# Patient Record
Sex: Male | Born: 1965 | Hispanic: No | Marital: Married
Health system: Southern US, Community
[De-identification: ages and names within clinical notes are randomized; demographics above are authoritative.]

---

## 2003-07-05 ENCOUNTER — Ambulatory Visit (HOSPITAL_COMMUNITY): Admission: RE | Admit: 2003-07-05 | Discharge: 2003-07-05 | Payer: Self-pay | Admitting: Orthopedic Surgery

## 2016-11-28 ENCOUNTER — Emergency Department: Payer: Self-pay

## 2016-11-28 ENCOUNTER — Emergency Department
Admission: EM | Admit: 2016-11-28 | Discharge: 2016-11-28 | Disposition: A | Discharge status: Home / Self Care | Payer: Self-pay | Attending: Emergency Medicine | Admitting: Emergency Medicine

## 2016-11-28 DIAGNOSIS — R0603 Acute respiratory distress: Secondary | ICD-10-CM

## 2016-11-28 DIAGNOSIS — J45901 Unspecified asthma with (acute) exacerbation: Secondary | ICD-10-CM | POA: Insufficient documentation

## 2016-11-28 LAB — CBC WITH DIFFERENTIAL/PLATELET
BASOS PCT: 1 %
Basophils Absolute: 0.1 10*3/uL (ref 0–0.1)
EOS ABS: 1.6 10*3/uL — AB (ref 0–0.7)
EOS PCT: 18 %
HCT: 44.4 % (ref 40.0–52.0)
Hemoglobin: 15.3 g/dL (ref 13.0–18.0)
Lymphocytes Relative: 26 %
Lymphs Abs: 2.4 10*3/uL (ref 1.0–3.6)
MCH: 29.7 pg (ref 26.0–34.0)
MCHC: 34.4 g/dL (ref 32.0–36.0)
MCV: 86.4 fL (ref 80.0–100.0)
MONO ABS: 0.7 10*3/uL (ref 0.2–1.0)
MONOS PCT: 8 %
NEUTROS PCT: 47 %
Neutro Abs: 4.4 10*3/uL (ref 1.4–6.5)
PLATELETS: 293 10*3/uL (ref 150–440)
RBC: 5.13 MIL/uL (ref 4.40–5.90)
RDW: 13.6 % (ref 11.5–14.5)
WBC: 9.2 10*3/uL (ref 3.8–10.6)

## 2016-11-28 LAB — BASIC METABOLIC PANEL
Anion gap: 10 (ref 5–15)
BUN: 12 mg/dL (ref 6–20)
CALCIUM: 9.2 mg/dL (ref 8.9–10.3)
CO2: 26 mmol/L (ref 22–32)
CREATININE: 1.36 mg/dL — AB (ref 0.61–1.24)
Chloride: 104 mmol/L (ref 101–111)
GFR calc non Af Amer: 59 mL/min — ABNORMAL LOW (ref >60)
Glucose, Bld: 112 mg/dL — ABNORMAL HIGH (ref 65–99)
Potassium: 3.7 mmol/L (ref 3.5–5.1)
SODIUM: 140 mmol/L (ref 135–145)

## 2016-11-28 LAB — TROPONIN I

## 2016-11-28 MED ORDER — IPRATROPIUM-ALBUTEROL 0.5-2.5 (3) MG/3ML IN SOLN
3.0000 mL | Freq: Once | RESPIRATORY_TRACT | Status: AC
  Administered 2016-11-28: 3 mL via RESPIRATORY_TRACT

## 2016-11-28 MED ORDER — METHYLPREDNISOLONE SODIUM SUCC 125 MG IJ SOLR
INTRAMUSCULAR | Status: AC
  Filled 2016-11-28: qty 2

## 2016-11-28 MED ORDER — PREDNISONE 20 MG PO TABS: ORAL_TABLET | ORAL | 0 refills | Status: AC

## 2016-11-28 MED ORDER — IPRATROPIUM-ALBUTEROL 0.5-2.5 (3) MG/3ML IN SOLN
3.0000 mL | Freq: Once | RESPIRATORY_TRACT | Status: AC
  Administered 2016-11-28: 3 mL via RESPIRATORY_TRACT
  Filled 2016-11-28: qty 3

## 2016-11-28 MED ORDER — ALBUTEROL SULFATE HFA 108 (90 BASE) MCG/ACT IN AERS
2.0000 | INHALATION_SPRAY | RESPIRATORY_TRACT | Status: DC | PRN
  Filled 2016-11-28: qty 6.7

## 2016-11-28 MED ORDER — IPRATROPIUM-ALBUTEROL 0.5-2.5 (3) MG/3ML IN SOLN
RESPIRATORY_TRACT | Status: AC
  Filled 2016-11-28: qty 3

## 2016-11-28 MED ORDER — METHYLPREDNISOLONE SODIUM SUCC 125 MG IJ SOLR
125.0000 mg | Freq: Once | INTRAMUSCULAR | Status: AC
  Administered 2016-11-28: 125 mg via INTRAVENOUS

## 2016-11-28 NOTE — ED Notes (Signed)
Ambulated patient to see if O2 would drop patient stayed at 97% on RA

## 2016-11-28 NOTE — ED Triage Notes (Signed)
Patient coming from home for shortness of breath and history of asthma. Patient did 3 back to back albuterol inhalers at home and normally takes prednisone on a regular basis but had run out. Patient was wheezing bilaterally and EMS gave 2 duenubs. Patient has been working outside a lot recently.

## 2016-11-28 NOTE — Discharge Instructions (Signed)
1. Take prednisone 60 mg daily 4 days. 2. Use albuterol inhaler 2 puffs every 4 hours as needed for wheezing or difficulty breathing. 3. If you have worsening difficulty breathing during your drive home, please proceed to the nearest ER for evaluation.

## 2016-11-28 NOTE — ED Notes (Signed)
Patient taken off 02 to see how he would do on RA

## 2016-11-28 NOTE — ED Notes (Signed)
Patient was placed on 2L [place].

## 2016-11-28 NOTE — ED Provider Notes (Signed)
[facility] Emergency Department Provider Note   ____________________________________________   First MD Initiated Contact with Patient 11/28/16 [phone]     (approximate)  I have reviewed the triage vital signs and the nursing notes.   HISTORY  Chief Complaint Respiratory Distress    HPI Carlos Oneill is a 51 y.o. male who presents to the ED from home via EMS with a chief complaint of respiratory distress. Patient has a history of asthma, recently diagnosed with lupus, who is from [place] here visiting family. Wife states patient has been cutting down trees over the past several days. Has been using his albuterol nebulizer more often than usual.Presents this morning secondary to respiratory distress. Wife states his doctor at home puts patient on prednisone 40 mg daily for several weeks at a time. Whenever he runs out, patient's breathing becomes labored. Patient has been out of prednisone for the past week. associated with chest tightness. Patient denies recent fever, chills, cough, abdominal pain, nausea, vomiting. Denies recent trauma. Nothing makes his symptoms better or worse.   Past medical history Asthma  There are no active problems to display for this patient.   No past surgical history on file.  Prior to Admission medications   Not on File    Allergies Other  Family history Mother also with lupus and respiratory issues  Social History Social History  Substance Use Topics  . Smoking status: Not on file  . Smokeless tobacco: Not on file  . Alcohol use Not on file  Nonsmoker  Review of Systems  Constitutional: No fever/chills. Eyes: No visual changes. ENT: No sore throat. Cardiovascular: positive for chest tightness. Respiratory: positive for shortness of breath. Gastrointestinal: No abdominal pain.  No nausea, no vomiting.  No diarrhea.  No constipation. Genitourinary: Negative for dysuria. Musculoskeletal: Negative for back  pain. Skin: Negative for rash. Neurological: Negative for headaches, focal weakness or numbness.   ____________________________________________   PHYSICAL EXAM:  VITAL SIGNS: ED Triage Vitals [11/28/16 0321]  Enc Vitals Group     BP (!) 134/93     Pulse Rate (!) 113     Resp      Temp      Temp src      SpO2      Weight      Height      Head Circumference      Peak Flow      Pain Score      Pain Loc      Pain Edu?      Excl. in GC?     Constitutional: Alert and oriented. Well appearing and in moderate acute distress. Eyes: Conjunctivae are normal. PERRL. EOMI. Head: Atraumatic. Nose: No congestion/rhinnorhea. Mouth/Throat: Mucous membranes are moist.  Oropharynx non-erythematous. Neck: No stridor.   Cardiovascular: Tachycardic rate, regular rhythm. Grossly normal heart sounds.  Good peripheral circulation. Respiratory: Increased respiratory effort.  No retractions. Lungs with diffuse wheezing. Gastrointestinal: Soft and nontender. No distention. No abdominal bruits. No CVA tenderness. Musculoskeletal: No lower extremity tenderness nor edema.  No joint effusions. Neurologic:  Normal speech and language. No gross focal neurologic deficits are appreciated.  Skin:  Skin is warm, dry and intact. No rash noted. Psychiatric: Mood and affect are normal. Speech and behavior are normal.  ____________________________________________   LABS (all labs ordered are listed, but only abnormal results are displayed)  Labs Reviewed  CBC WITH DIFFERENTIAL/PLATELET - Abnormal; Notable for the following:       Result Value  Eosinophils Absolute 1.6 (*)    All other components within normal limits  BASIC METABOLIC PANEL - Abnormal; Notable for the following:    Glucose, Bld 112 (*)    Creatinine, Ser 1.36 (*)    GFR calc non Af Amer 59 (*)    All other components within normal limits  TROPONIN I   ____________________________________________  EKG  ED ECG REPORT I, SUNG,JADE  J, the attending physician, personally viewed and interpreted this ECG.   Date: 11/28/2016  EKG Time: 0325  Rate: 113  Rhythm: sinus tachycardia  Axis: normal  Intervals:none  ST&T Change: nonspecific  ____________________________________________  RADIOLOGY  Dg Chest 2 View  Result Date: 11/28/2016 CLINICAL DATA:  Shortness of breath.  History of asthma. EXAM: CHEST  2 VIEW COMPARISON:  None. FINDINGS: The heart size and mediastinal contours are within normal limits. Both lungs are clear. The visualized skeletal structures are unremarkable. IMPRESSION: No active cardiopulmonary disease. Electronically Signed   By: Burman NievesWilliam  Stevens M.D.   On: 11/28/2016 04:26    ____________________________________________   PROCEDURES  Procedure(s) performed: None  Procedures  Critical Care performed: No  ____________________________________________   INITIAL IMPRESSION / ASSESSMENT AND PLAN / ED COURSE  As part of my medical decision making, I reviewed the following data within the electronic MEDICAL RECORD NUMBER History obtained from family, Nursing notes reviewed and incorporated, EKG interpreted, Radiograph reviewed and Notes from prior ED visits.   51 year old male with a history of asthma who presents with respiratory distress. Differential includes, but is not limited to, viral syndrome, bronchitis including COPD exacerbation, pneumonia, reactive airway disease including asthma, CHF including exacerbation with or without pulmonary/interstitial edema, pneumothorax, ACS, thoracic trauma, and pulmonary embolism. EMS administered 2 duonebs en route to the ED. patient used nebulizer prior to calling EMS. Will add IV Solu-Medrol. Will check x-ray and lab work, EKG, chest x-ray and monitor. Discussed with patient and spouse that I anticipate he will need to be hospitalized. Wife states they are supposed to be driving back home to [place] today and would prefer discharge. We agree to period of  observation and multiple reassessments before making a final disposition plan.  Clinical Course as of Nov 28 729  Fri Nov 28, 2016  [id number] Updated patient and spouse of laboratory and x-ray results. Remains fairly wheezy on the right; scattered wheezes on the left. Again recommended admission. Spouse states they're in insurance will not cover out of network hospitalizations and they financially cannot afford to be hospitalized. Will administer another DuoNeb and continue monitoring patient.  [JS]  [id number] Patient ambulated with steady gait. RA saturations maintained at 97-100%. Still mildly wheezing right greater than left. Patient states he is feeling better. Both patient and spouse are insistent that they cannot afford hospitalization. They are leaving this morning to head back home to [place]. Will dispense rescue inhaler, prescription for prednisone burst, and patient will see his PCP as soon as he gets home. Strict return precautions given. Both verbalize understanding and agree with plan of care.  [JS]    Clinical Course User Index [JS] Irean HongSung, Jade J, MD     ____________________________________________   FINAL CLINICAL IMPRESSION(S) / ED DIAGNOSES  Final diagnoses:  Moderate asthma with exacerbation, unspecified whether persistent  Respiratory distress      NEW MEDICATIONS STARTED DURING THIS VISIT:  New Prescriptions   No medications on file     Note:  This document was prepared using Dragon voice recognition software and may include unintentional dictation  errors.    Irean Hong, MD 11/28/16 [phone]

## 2018-05-31 IMAGING — CR DG CHEST 2V
2 series · 2 of 2 positions shown · non-contrast
Comparison: None.

CLINICAL DATA: Shortness of breath.  History of asthma.

EXAM:
CHEST  2 VIEW

[chest lat]
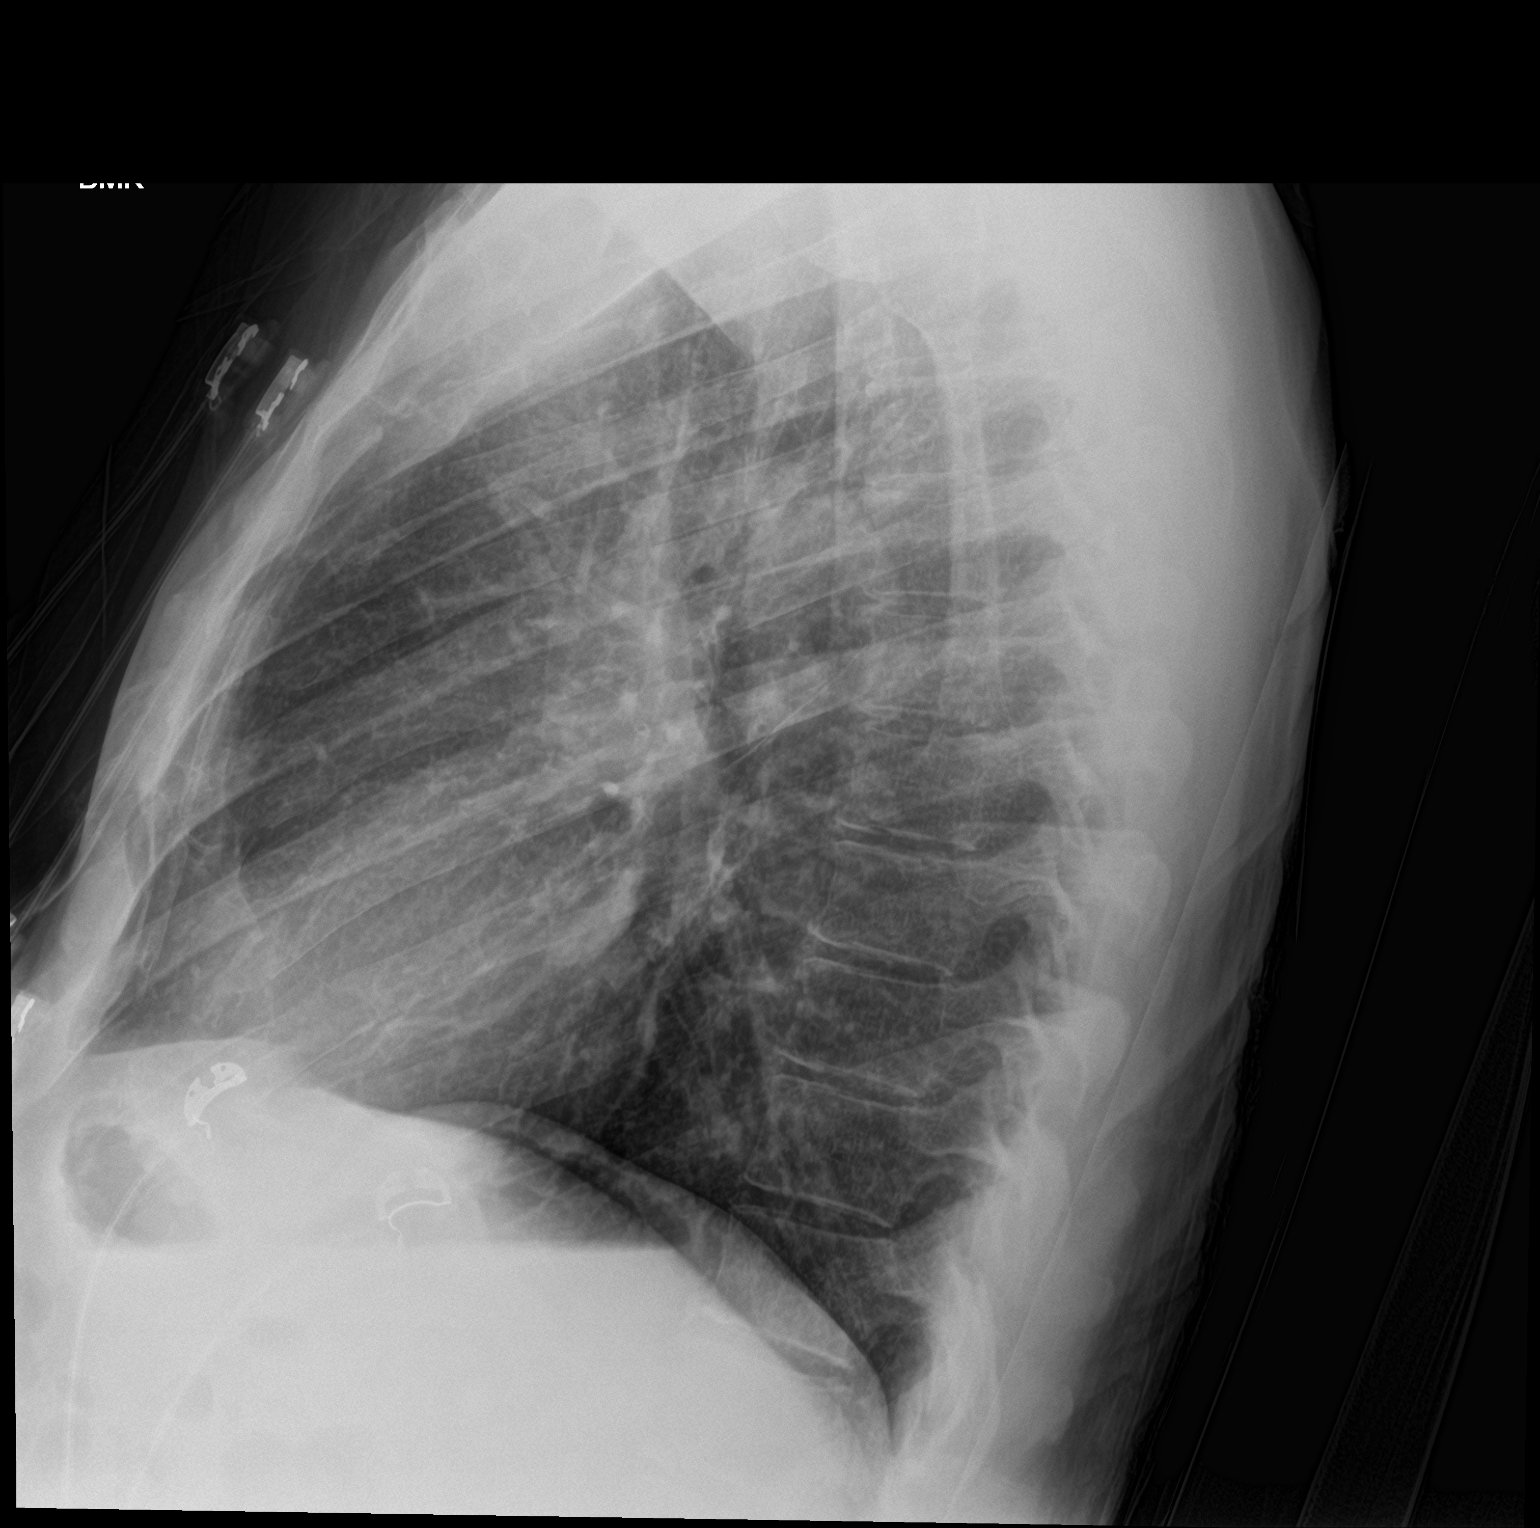

[chest ap]
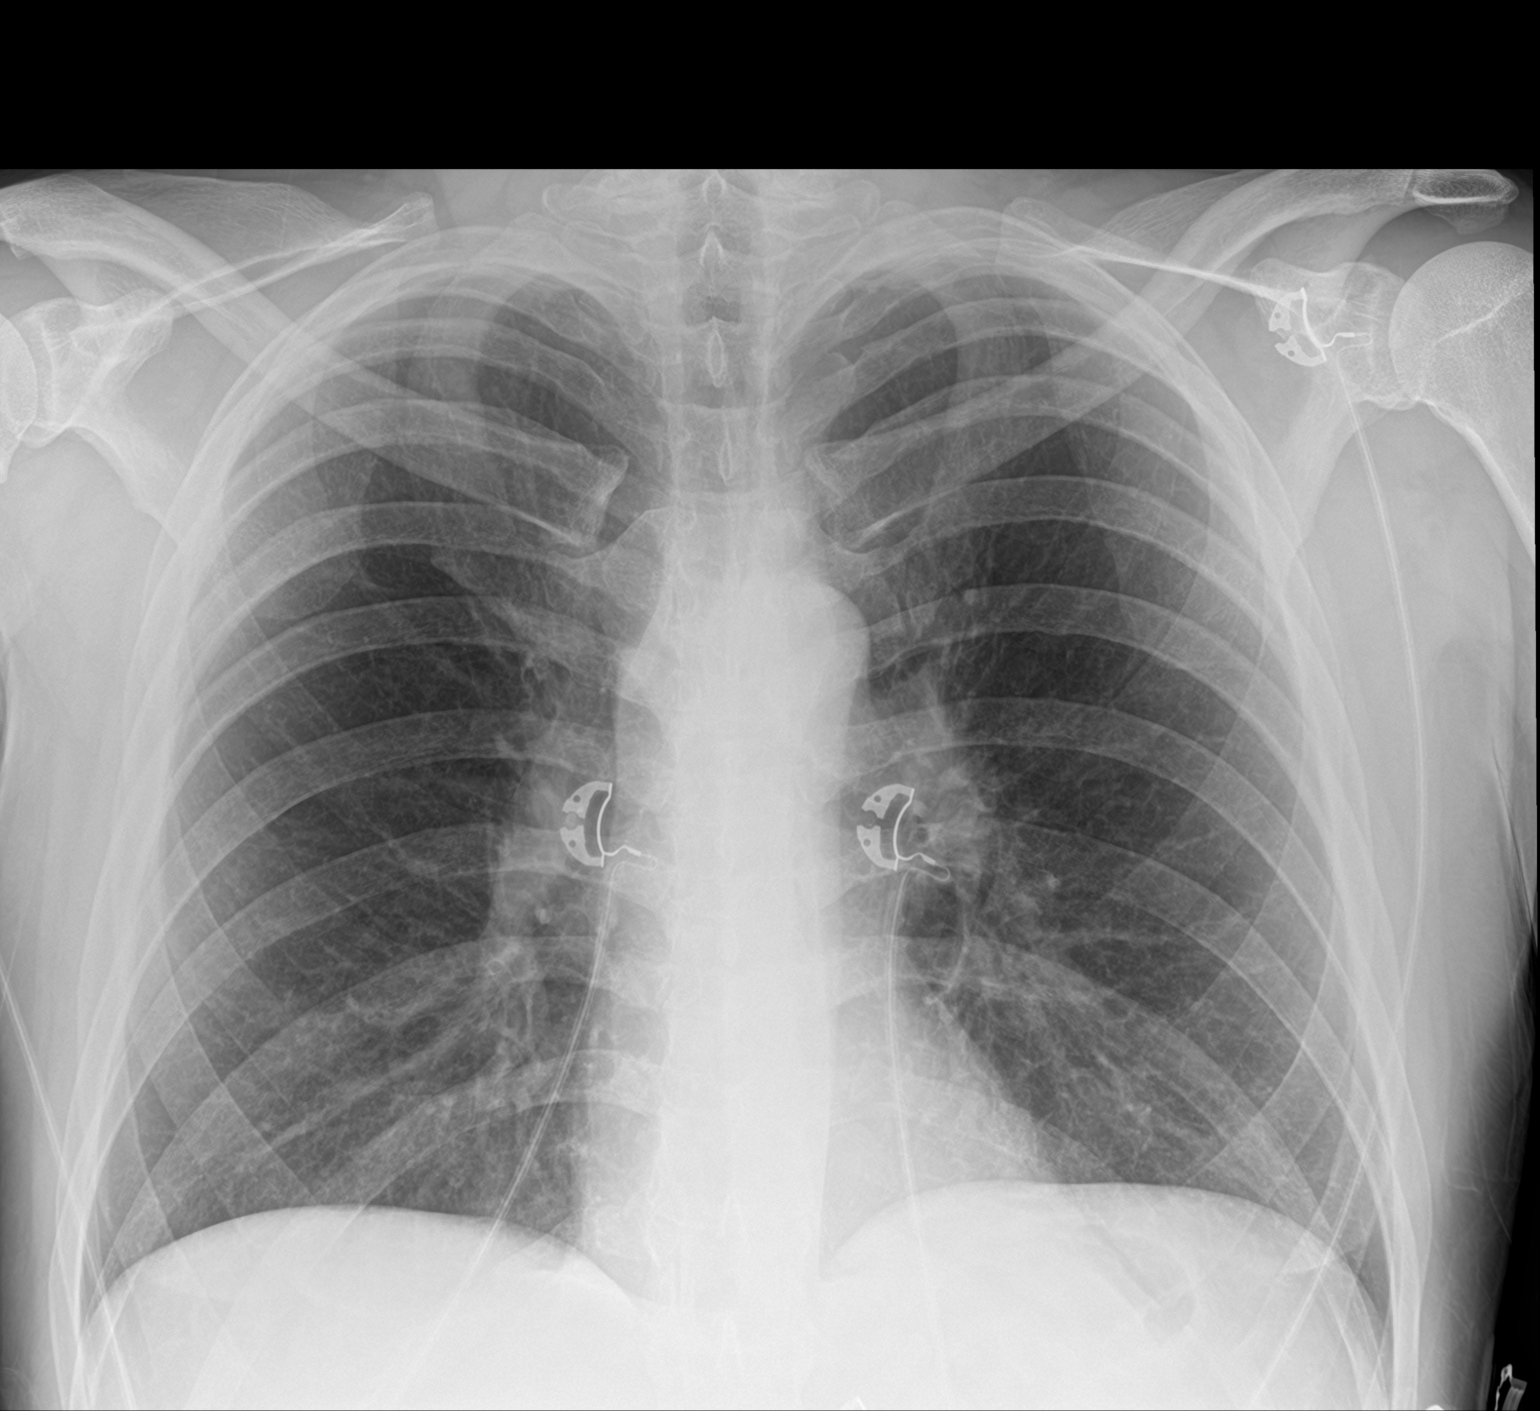

[2 of 2 positions shown; findings below may reference images not displayed]

FINDINGS: The heart size and mediastinal contours are within normal limits.
Both lungs are clear. The visualized skeletal structures are
unremarkable.
IMPRESSION: No active cardiopulmonary disease.
